# Patient Record
Sex: Female | Born: 1995 | Race: Black or African American | Hispanic: No | Marital: Single | Smoking: Never smoker
Health system: Southern US, Community
[De-identification: ages and names within clinical notes are randomized; demographics above are authoritative.]

## PROBLEM LIST (undated history)

## (undated) DIAGNOSIS — A048 Other specified bacterial intestinal infections: Secondary | ICD-10-CM

## (undated) DIAGNOSIS — G4700 Insomnia, unspecified: Secondary | ICD-10-CM

## (undated) DIAGNOSIS — F419 Anxiety disorder, unspecified: Secondary | ICD-10-CM

## (undated) HISTORY — DX: Anxiety disorder, unspecified: F41.9

## (undated) HISTORY — DX: Other specified bacterial intestinal infections: A04.8

## (undated) HISTORY — DX: Insomnia, unspecified: G47.00

## (undated) HISTORY — PX: NO PAST SURGERIES: SHX2092

---

## 2016-06-25 ENCOUNTER — Emergency Department (HOSPITAL_COMMUNITY)
Admission: EM | Admit: 2016-06-25 | Discharge: 2016-06-25 | Disposition: A | Discharge status: Home / Self Care | Payer: BLUE CROSS/BLUE SHIELD | Attending: Emergency Medicine | Admitting: Emergency Medicine

## 2016-06-25 ENCOUNTER — Emergency Department (HOSPITAL_COMMUNITY): Payer: BLUE CROSS/BLUE SHIELD

## 2016-06-25 ENCOUNTER — Encounter (HOSPITAL_COMMUNITY): Payer: Self-pay | Admitting: Emergency Medicine

## 2016-06-25 DIAGNOSIS — R55 Syncope and collapse: Secondary | ICD-10-CM | POA: Insufficient documentation

## 2016-06-25 DIAGNOSIS — E860 Dehydration: Secondary | ICD-10-CM | POA: Insufficient documentation

## 2016-06-25 LAB — BASIC METABOLIC PANEL
Anion gap: 7 (ref 5–15)
BUN: 11 mg/dL (ref 6–20)
CHLORIDE: 107 mmol/L (ref 101–111)
CO2: 26 mmol/L (ref 22–32)
CREATININE: 0.74 mg/dL (ref 0.44–1.00)
Calcium: 9.2 mg/dL (ref 8.9–10.3)
GFR calc non Af Amer: >60 mL/min (ref >60)
Glucose, Bld: 94 mg/dL (ref 65–99)
POTASSIUM: 4.2 mmol/L (ref 3.5–5.1)
SODIUM: 140 mmol/L (ref 135–145)

## 2016-06-25 LAB — URINALYSIS, ROUTINE W REFLEX MICROSCOPIC
Bilirubin Urine: NEGATIVE
GLUCOSE, UA: NEGATIVE mg/dL
Hgb urine dipstick: NEGATIVE
KETONES UR: NEGATIVE mg/dL
LEUKOCYTES UA: NEGATIVE
NITRITE: NEGATIVE
PROTEIN: NEGATIVE mg/dL
Specific Gravity, Urine: 1.009 (ref 1.005–1.030)
pH: 7.5 (ref 5.0–8.0)

## 2016-06-25 LAB — I-STAT BETA HCG BLOOD, ED (MC, WL, AP ONLY)

## 2016-06-25 LAB — CBC
HCT: 38.8 % (ref 36.0–46.0)
Hemoglobin: 11.8 g/dL — ABNORMAL LOW (ref 12.0–15.0)
MCH: 25.8 pg — ABNORMAL LOW (ref 26.0–34.0)
MCHC: 30.4 g/dL (ref 30.0–36.0)
MCV: 84.7 fL (ref 78.0–100.0)
PLATELETS: 264 10*3/uL (ref 150–400)
RBC: 4.58 MIL/uL (ref 3.87–5.11)
RDW: 14.3 % (ref 11.5–15.5)
WBC: 6.4 10*3/uL (ref 4.0–10.5)

## 2016-06-25 LAB — D-DIMER, QUANTITATIVE (NOT AT ARMC): D-Dimer, Quant: 0.56 ug/mL-FEU — ABNORMAL HIGH (ref 0.00–0.50)

## 2016-06-25 LAB — CBG MONITORING, ED: Glucose-Capillary: 94 mg/dL (ref 65–99)

## 2016-06-25 MED ORDER — IOPAMIDOL (ISOVUE-370) INJECTION 76%
100.0000 mL | Freq: Once | INTRAVENOUS | Status: AC | PRN
  Administered 2016-06-25: 100 mL via INTRAVENOUS

## 2016-06-25 MED ORDER — SODIUM CHLORIDE 0.9 % IV BOLUS (SEPSIS)
1000.0000 mL | Freq: Once | INTRAVENOUS | Status: AC
  Administered 2016-06-25: 1000 mL via INTRAVENOUS

## 2016-06-25 NOTE — Discharge Instructions (Addendum)
Make sure to drink plenty of fluids, at least 8, 8oz glasses of water daily. Patient to have 3 hearty meals daily. Please follow-up and establish care with a primary care provider for further workup and evaluation of your symptoms. Please return to emergency department if you develop any new or worsening symptoms.

## 2016-06-25 NOTE — ED Notes (Signed)
Pt ambulated for over 50 feet.  Pt maintained a steady gait and showed no signs of distress.  Heart rate remained at 125 and RR stayed at 33.

## 2016-06-25 NOTE — ED Notes (Signed)
Bed: [id number] Expected date:  Expected time:  Means of arrival:  Comments: EMS- 19yo F, syncope x 2

## 2016-06-25 NOTE — ED Provider Notes (Signed)
[facility] Provider Note   CSN: [id number] Arrival date & time: 06/25/16  1635     History   Chief Complaint Chief Complaint  Patient presents with  . Loss of Consciousness    HPI Marissa Dickson is a 20 y.o. female who presents following 2 episodes of syncope that occurred this morning. Patient reports she was about to get shower this morning and she began feeling dizzy, nauseated and then woke up on the floor of the bathroom. Patient does not think she hit her head and denies headache, dizziness, lightheadedness at this time. Patient later had another episode of syncope outside on the grass during a fire drill in her apartment complex. She was surrounded by people when she woke up. Patient denies any pain. She states she only feels drowsy now. Patient has not eaten today. Patient denies any chest pain, shortness of breath, abdominal pain, current nausea, vomiting, urinary symptoms.  HPI  History reviewed. No pertinent past medical history.  There are no active problems to display for this patient.   History reviewed. No pertinent surgical history.  OB History    No data available       Home Medications    Prior to Admission medications   Not on File    Family History History reviewed. No pertinent family history.  Social History Social History  Substance Use Topics  . Smoking status: Never Smoker  . Smokeless tobacco: Never Used  . Alcohol use Yes     Comment: occasionally     Allergies   Review of patient's allergies indicates no known allergies.   Review of Systems Review of Systems  Constitutional: Negative for chills and fever.  HENT: Negative for facial swelling and sore throat.   Respiratory: Negative for shortness of breath.   Cardiovascular: Negative for chest pain.  Gastrointestinal: Negative for abdominal pain, nausea and vomiting.  Genitourinary: Negative for dysuria.  Musculoskeletal: Negative for back pain.  Skin: Negative for  rash and wound.  Neurological: Negative for dizziness, light-headedness and headaches.  Psychiatric/Behavioral: The patient is not nervous/anxious.      Physical Exam Updated Vital Signs BP 116/74   Pulse 77   Temp 99.7 F (37.6 C) (Oral)   Resp 17   SpO2 100%   Physical Exam  Constitutional: She appears well-developed and well-nourished. No distress.  HENT:  Head: Normocephalic and atraumatic.  Mouth/Throat: Oropharynx is clear and moist. No oropharyngeal exudate.  Eyes: Conjunctivae and EOM are normal. Pupils are equal, round, and reactive to light. Right eye exhibits no discharge. Left eye exhibits no discharge. No scleral icterus.  Neck: Normal range of motion. Neck supple. No thyromegaly present.  Cardiovascular: Normal rate, regular rhythm, normal heart sounds and intact distal pulses.  Exam reveals no gallop and no friction rub.   No murmur heard. Pulmonary/Chest: Effort normal and breath sounds normal. No stridor. No respiratory distress. She has no wheezes. She has no rales.  Abdominal: Soft. Bowel sounds are normal. She exhibits no distension. There is no tenderness. There is no rebound and no guarding.  Musculoskeletal: She exhibits no edema.  Lymphadenopathy:    She has no cervical adenopathy.  Neurological: She is alert. Coordination normal.  CN 3-12 intact; normal sensation throughout; 5/5 strength in all 4 extremities; equal bilateral grip strength; no ataxia on finger to nose   Skin: Skin is warm and dry. No rash noted. She is not diaphoretic. No pallor.  Psychiatric: She has a normal mood and affect.  Nursing  note and vitals reviewed.    ED Treatments / Results  Labs (all labs ordered are listed, but only abnormal results are displayed) Labs Reviewed  CBC - Abnormal; Notable for the following:       Result Value   Hemoglobin 11.8 (*)    MCH 25.8 (*)    All other components within normal limits  D-DIMER, QUANTITATIVE (NOT AT [facility]) - Abnormal; Notable for  the following:    D-Dimer, Quant 0.56 (*)    All other components within normal limits  BASIC METABOLIC PANEL  URINALYSIS, ROUTINE W REFLEX MICROSCOPIC (NOT AT [facility])  CBG MONITORING, ED  I-STAT BETA HCG BLOOD, ED ([facility], [facility], [facility] ONLY)    EKG  EKG Interpretation  Date/Time:  Friday June 25 2016 16:51:55 EDT Ventricular Rate:  90 PR Interval:    QRS Duration: 96 QT Interval:  344 QTC Calculation: 421 R Axis:   79 Text Interpretation:  Sinus rhythm Borderline short PR interval No old tracing to compare Confirmed by BELFI  MD, MELANIE ([id number]) on 06/25/2016 5:52:29 PM       Radiology Ct Angio Chest Pe W And/or Wo Contrast  Result Date: 06/25/2016 CLINICAL DATA:  Syncopal episode today at 9:15 this morning with second syncopal episode this afternoon. Elevated D-dimer. EXAM: CT ANGIOGRAPHY CHEST WITH CONTRAST TECHNIQUE: Multidetector CT imaging of the chest was performed using the standard protocol during bolus administration of intravenous contrast. Multiplanar CT image reconstructions and MIPs were obtained to evaluate the vascular anatomy. CONTRAST:  100 mL Isovue 370 IV. COMPARISON:  None. FINDINGS: Cardiovascular: Heart is normal in size. Thoracic aorta is within normal. Pulmonary arterial system is normal without emboli. Mediastinum/Nodes: No evidence of mediastinal or hilar adenopathy. Subtle air-fluid level over the distal esophagus likely due to dysmotility or reflux. Lungs/Pleura: Lungs are well inflated without consolidation or effusion. No pneumothorax. Airways are normal. Upper Abdomen: Within normal. Musculoskeletal: Within normal. Review of the MIP images confirms the above findings. IMPRESSION: No acute cardiopulmonary disease. No evidence of pulmonary embolism. Electronically Signed   By: Elberta Fortisaniel  Boyle M.D.   On: 06/25/2016 21:43    Procedures Procedures (including critical care time)  Medications Ordered in ED Medications  sodium chloride 0.9 % bolus 1,000 mL (0 mLs  Intravenous Stopped 06/25/16 2000)  sodium chloride 0.9 % bolus 1,000 mL (0 mLs Intravenous Stopped 06/25/16 2307)  iopamidol (ISOVUE-370) 76 % injection 100 mL (100 mLs Intravenous Contrast Given 06/25/16 2051)     Initial Impression / Assessment and Plan / ED Course  I have reviewed the triage vital signs and the nursing notes.  Pertinent labs & imaging results that were available during my care of the patient were reviewed by me and considered in my medical decision making (see chart for details).  Clinical Course    CBC, BMP, UA WNL. EKG shows Sinus rhythm Borderline short PR interval. Normal neuro exam, no focal deficits. Patient tachycardic throughout ED course, concern for PE prompted D-dimer. D-dimer 0.56. CT angio shows no PE or active cardiopulmonary disease. Patient asymptomatic in ED. Given 2L NS. Suspect dehydration as cause of symptoms. Patient admits to not drinking enough water. Also had not eaten today. Discharge home with follow up to PCP for further evaluation and treatment of symptoms. Potential further workup including Holter monitor. I discussed patient case with Dr. Fredderick PhenixBelfi who guided the patient's management and agrees with plan. Patient discharged in satisfactory condition.  Final Clinical Impressions(s) / ED Diagnoses   Final diagnoses:  Syncope,  unspecified syncope type  Dehydration    New Prescriptions There are no discharge medications for this patient.    Emi Holes, PA-C 06/26/16 2134    Rolan Bucco, MD 06/27/16 [phone]

## 2016-06-25 NOTE — ED Provider Notes (Signed)
Chest CT angios was reviewed.  There is no evidence of pulmonary embolus.  Patient will be discharged with instructions for hydration and follow-up   Earley FavorGail [place] Ice, NP 06/25/16 2300    Rolan BuccoMelanie Belfi, MD 06/25/16 2352

## 2016-06-25 NOTE — ED Triage Notes (Signed)
Pt c/o sycopal episode today about 0915, went outside at 1545 and had second syncopal episode. Length of unconsciousness unknown. No hx syncopal episodes, no medical hx. Reports she has not eaten today. Shallow abrasion to chin and left knee without signs of fracture.

## 2016-06-25 NOTE — Progress Notes (Signed)
Patient listed a snot having insurance [place].  [facility].  Patient reports she has insurance through her school [employer].  She [facility]  [facility] encouraged patient to call the phone number on the bac of her insurance card or go to insurance website to help her find a doctor who is close to her school for primary care needs.  Patient verbalized understanding.  No further EDCM needs at this time.

## 2016-06-25 NOTE — ED Provider Notes (Deleted)
CT angiogram was reviewed.  There is no evidence of pulmonary embolus.  Patient is discharged home with dehydration instructions and follow-up   Earley FavorGail Parul Porcelli, NP 06/25/16 2303

## 2017-11-26 IMAGING — CT CT ANGIO CHEST
2 of 6 series · 19 of 36 positions shown · IV contrast (ISOVUE 370)
Comparison: None.

CLINICAL DATA: Syncopal episode today at [DATE] this morning with
second syncopal episode this afternoon. Elevated D-dimer.

EXAM:
CT ANGIOGRAPHY CHEST WITH CONTRAST
TECHNIQUE: Multidetector CT imaging of the chest was performed using the
standard protocol during bolus administration of intravenous
contrast. Multiplanar CT image reconstructions and MIPs were
obtained to evaluate the vascular anatomy.
CONTRAST:  100 mL Isovue 370 IV.

[Series 8: thins for pacs · axial · 0.57mm/px · z∈[-298,-94]mm · 18 of 229 slices shown]
[im 12/229  lung]
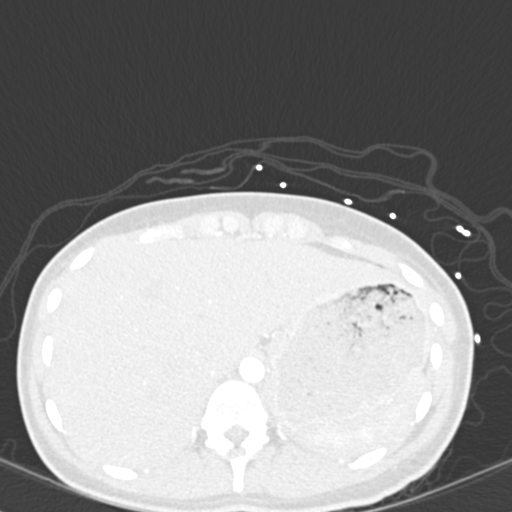
[im 23/229  mediastinal]
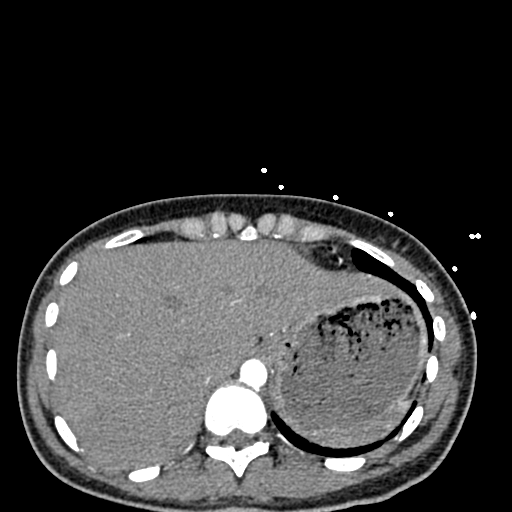
[im 35/229  lung]
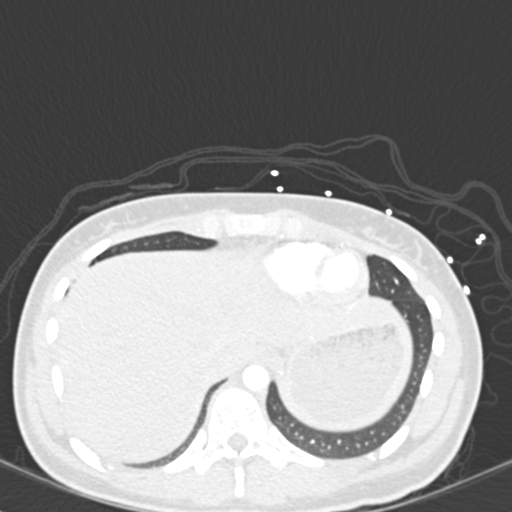
[im 46/229  mediastinal]
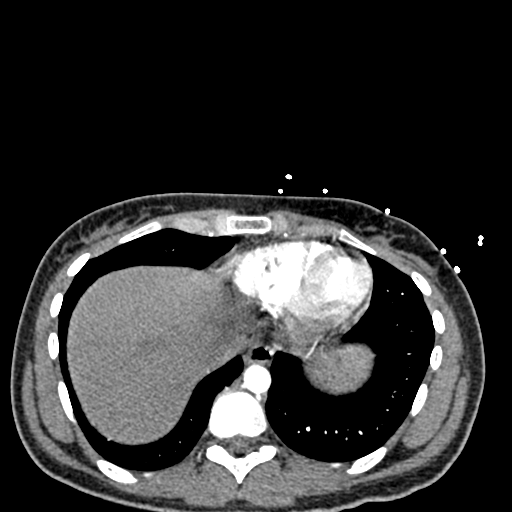
[im 58/229  lung]
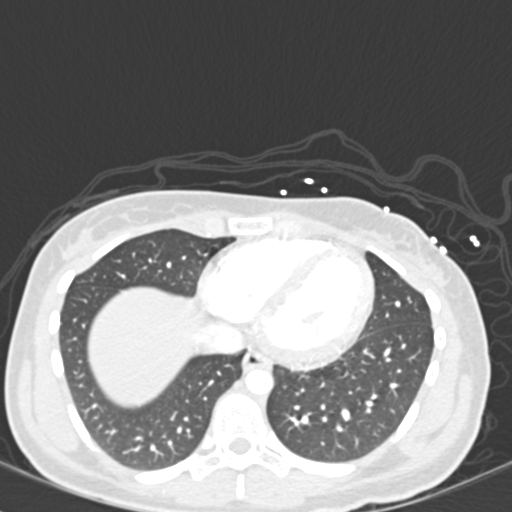
[im 69/229  mediastinal]
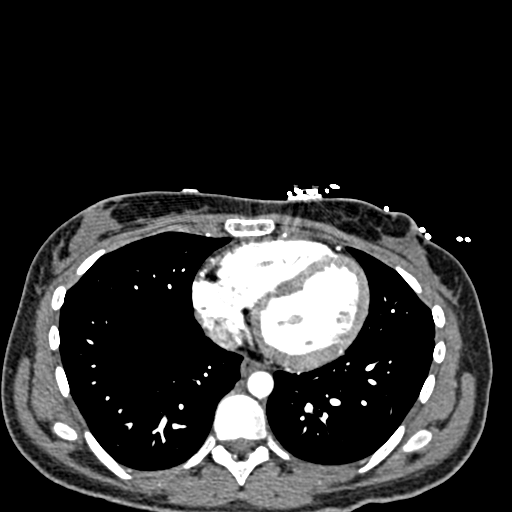
[im 80/229  lung]
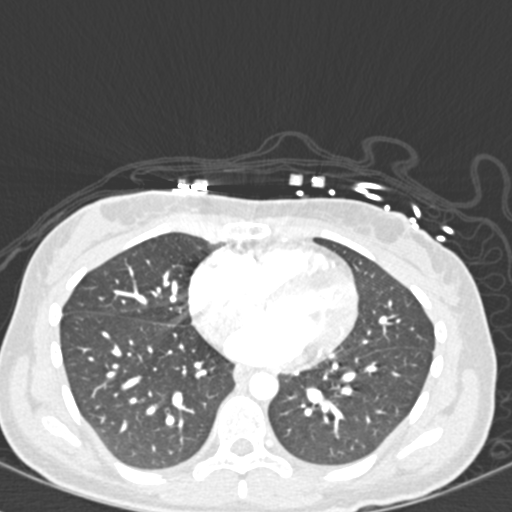
[im 92/229  mediastinal]
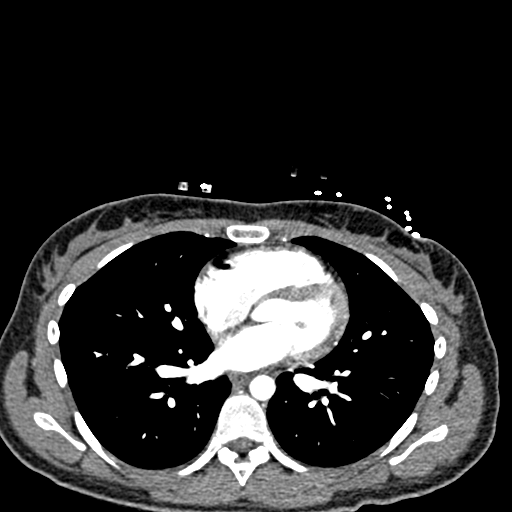
[im 103/229  lung]
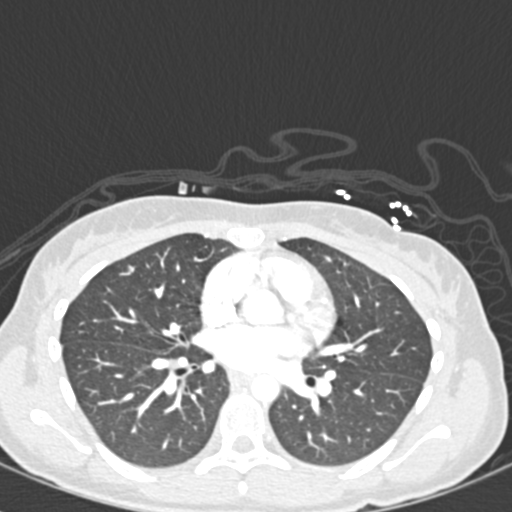
[im 126/229  mediastinal]
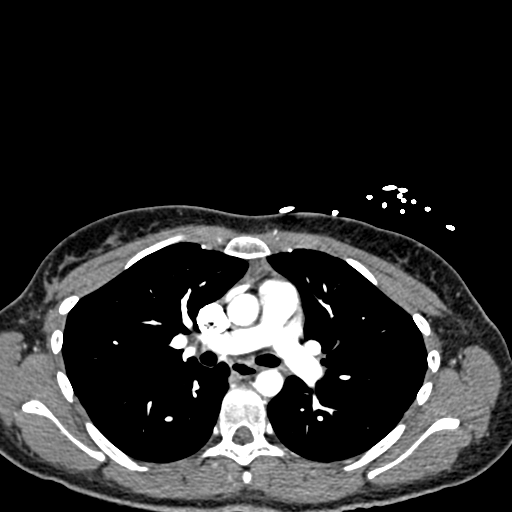
[im 137/229  lung]
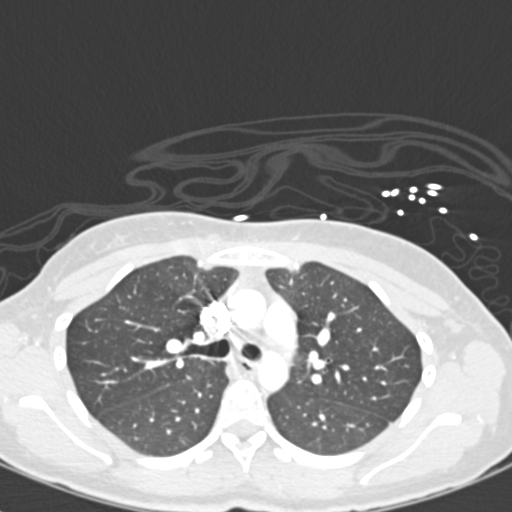
[im 149/229  mediastinal]
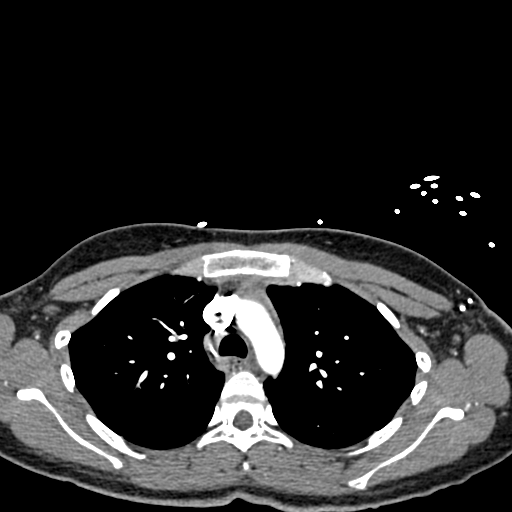
[im 160/229  lung]
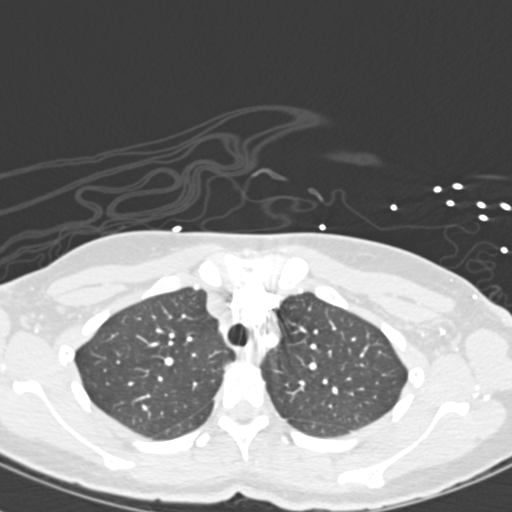
[im 172/229  mediastinal]
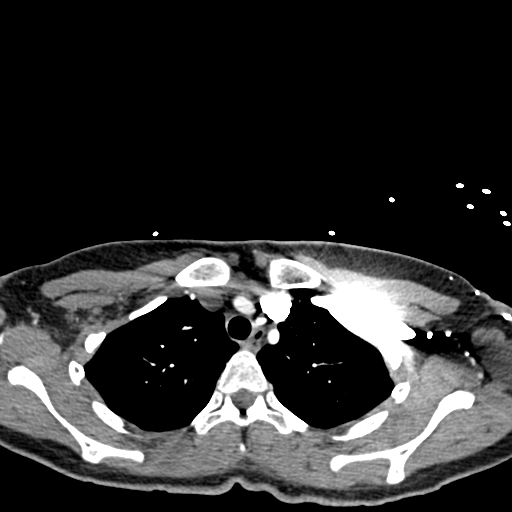
[im 183/229  lung]
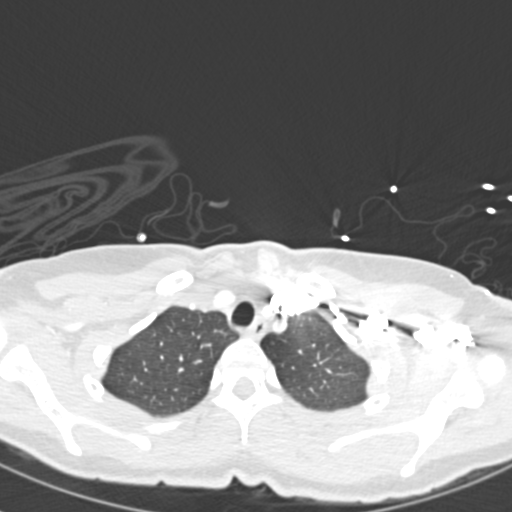
[im 194/229  mediastinal]
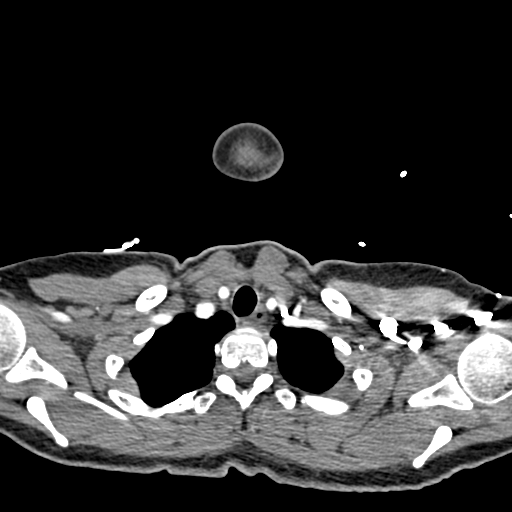
[im 206/229  lung]
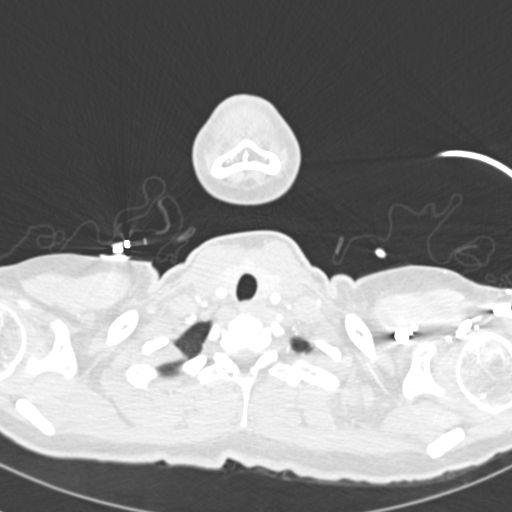
[im 217/229  mediastinal]
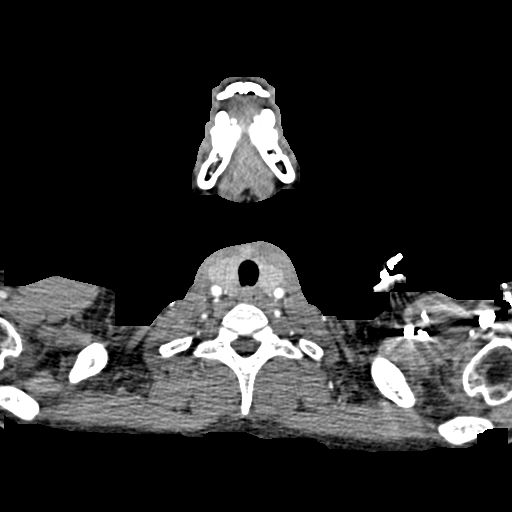

[Series 10: coronal mpr · coronal · 0.47mm/px · 1 of 91 slices shown]
[im 46/91  mediastinal]
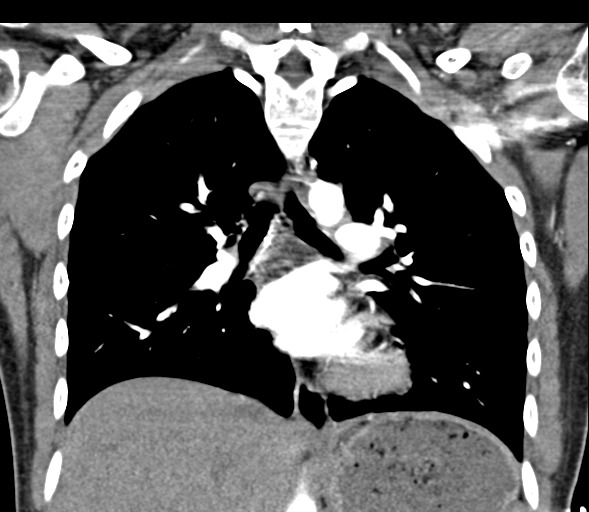

[19 of 36 positions shown; findings below may reference images not displayed]

FINDINGS: Cardiovascular: Heart is normal in size. Thoracic aorta is within
normal. Pulmonary arterial system is normal without emboli.

Mediastinum/Nodes: No evidence of mediastinal or hilar adenopathy.
Subtle air-fluid level over the distal esophagus likely due to
dysmotility or reflux.

Lungs/Pleura: Lungs are well inflated without consolidation or
effusion. No pneumothorax. Airways are normal.

Upper Abdomen: Within normal.

Musculoskeletal: Within normal.

Review of the MIP images confirms the above findings.
IMPRESSION: No acute cardiopulmonary disease. No evidence of pulmonary embolism.

## 2020-12-23 ENCOUNTER — Other Ambulatory Visit: Payer: Self-pay

## 2020-12-23 ENCOUNTER — Encounter: Payer: Self-pay | Admitting: Nurse Practitioner

## 2020-12-23 ENCOUNTER — Ambulatory Visit (HOSPITAL_COMMUNITY)
Admission: EM | Admit: 2020-12-23 | Discharge: 2020-12-23 | Disposition: A | Discharge status: Home / Self Care | Payer: BLUE CROSS/BLUE SHIELD | Attending: Physician Assistant | Admitting: Physician Assistant

## 2020-12-23 ENCOUNTER — Encounter (HOSPITAL_COMMUNITY): Payer: Self-pay

## 2020-12-23 DIAGNOSIS — R1013 Epigastric pain: Secondary | ICD-10-CM | POA: Insufficient documentation

## 2020-12-23 DIAGNOSIS — R112 Nausea with vomiting, unspecified: Secondary | ICD-10-CM | POA: Insufficient documentation

## 2020-12-23 LAB — COMPREHENSIVE METABOLIC PANEL
ALT: 29 U/L (ref 0–44)
AST: 21 U/L (ref 15–41)
Albumin: 4.3 g/dL (ref 3.5–5.0)
Alkaline Phosphatase: 49 U/L (ref 38–126)
Anion gap: 14 (ref 5–15)
BUN: 6 mg/dL (ref 6–20)
CO2: 24 mmol/L (ref 22–32)
Calcium: 9.4 mg/dL (ref 8.9–10.3)
Chloride: 101 mmol/L (ref 98–111)
Creatinine, Ser: 0.95 mg/dL (ref 0.44–1.00)
GFR, Estimated: >60 mL/min (ref >60)
Glucose, Bld: 73 mg/dL (ref 70–99)
Potassium: 3.6 mmol/L (ref 3.5–5.1)
Sodium: 139 mmol/L (ref 135–145)
Total Bilirubin: 0.7 mg/dL (ref 0.3–1.2)
Total Protein: 7.4 g/dL (ref 6.5–8.1)

## 2020-12-23 LAB — POCT URINALYSIS DIPSTICK, ED / UC
Glucose, UA: NEGATIVE mg/dL
Ketones, ur: >=160 mg/dL — AB
Leukocytes,Ua: NEGATIVE
Nitrite: NEGATIVE
Protein, ur: NEGATIVE mg/dL
Specific Gravity, Urine: >=1.03 (ref 1.005–1.030)
Urobilinogen, UA: 0.2 mg/dL (ref 0.0–1.0)
pH: 5.5 (ref 5.0–8.0)

## 2020-12-23 LAB — CBC WITH DIFFERENTIAL/PLATELET
Abs Immature Granulocytes: 0.01 10*3/uL (ref 0.00–0.07)
Basophils Absolute: 0.1 10*3/uL (ref 0.0–0.1)
Basophils Relative: 2 %
Eosinophils Absolute: 0 10*3/uL (ref 0.0–0.5)
Eosinophils Relative: 1 %
HCT: 41.9 % (ref 36.0–46.0)
Hemoglobin: 12.9 g/dL (ref 12.0–15.0)
Immature Granulocytes: 0 %
Lymphocytes Relative: 33 %
Lymphs Abs: 1.7 10*3/uL (ref 0.7–4.0)
MCH: 25.3 pg — ABNORMAL LOW (ref 26.0–34.0)
MCHC: 30.8 g/dL (ref 30.0–36.0)
MCV: 82.3 fL (ref 80.0–100.0)
Monocytes Absolute: 0.3 10*3/uL (ref 0.1–1.0)
Monocytes Relative: 6 %
Neutro Abs: 3 10*3/uL (ref 1.7–7.7)
Neutrophils Relative %: 58 %
Platelets: 264 10*3/uL (ref 150–400)
RBC: 5.09 MIL/uL (ref 3.87–5.11)
RDW: 16.2 % — ABNORMAL HIGH (ref 11.5–15.5)
WBC: 5.1 10*3/uL (ref 4.0–10.5)
nRBC: 0 % (ref 0.0–0.2)

## 2020-12-23 LAB — LIPASE, BLOOD: Lipase: 27 U/L (ref 11–51)

## 2020-12-23 LAB — POC URINE PREG, ED: Preg Test, Ur: NEGATIVE

## 2020-12-23 NOTE — ED Triage Notes (Signed)
Pt in with c/o epigastric pain and back pain that has been going on for 2 weeks now  States she was having urinary frequency that has resolved

## 2020-12-23 NOTE — ED Provider Notes (Signed)
[facility]    CSN: [id number] Arrival date & time: 12/23/20  1134      History   Chief Complaint Chief Complaint  Patient presents with  . Abdominal Pain  . Back Pain    HPI Marissa Dickson is a 25 y.o. female.   Patient presents today with a 3 week history of persistent upper abdominal pain.  She denies any change in diet prior to symptom onset.  Reports pain is rated 4 on a 0-10 pain scale, localized to epigastrium without radiation, described as aching, worse following oral intake, no alleviating factors identified.  She has not identified any specific trigger foods.  She has been evaluated by [facility] and prescribed Zofran and Prilosec which she has been taking as needed.  She has not seen a gastroenterologist in the past and denies history of GI condition.  She denies previous abdominal surgeries still has gallbladder and appendix.  She denies any medication changes.  Denies any bowel changes; initially had some constipation but this resolved with MiraLAX and she is now having normal bowel movements without blood or mucus.     History reviewed. No pertinent past medical history.  There are no problems to display for this patient.   History reviewed. No pertinent surgical history.  OB History   No obstetric history on file.      Home Medications    Prior to Admission medications   Not on File    Family History Family History  Problem Relation Age of Onset  . Cancer Mother     Social History Social History   Tobacco Use  . Smoking status: Never Smoker  . Smokeless tobacco: Never Used  Substance Use Topics  . Alcohol use: Yes    Comment: occasionally  . Drug use: No     Allergies   Patient has no known allergies.   Review of Systems Review of Systems  Constitutional: Positive for appetite change. Negative for activity change, fatigue and fever.  Respiratory: Negative for cough and shortness of breath.   Cardiovascular:  Negative for chest pain.  Gastrointestinal: Positive for abdominal pain and nausea. Negative for blood in stool, constipation, diarrhea and vomiting.  Neurological: Negative for dizziness, light-headedness and headaches.     Physical Exam Triage Vital Signs ED Triage Vitals  Enc Vitals Group     BP 12/23/20 1257 138/89     Pulse Rate 12/23/20 1257 77     Resp 12/23/20 1257 18     Temp 12/23/20 1257 97.8 F (36.6 C)     Temp src --      SpO2 12/23/20 1257 100 %     Weight --      Height --      Head Circumference --      Peak Flow --      Pain Score 12/23/20 1255 2     Pain Loc --      Pain Edu? --      Excl. in GC? --    No data found.  Updated Vital Signs BP 138/89   Pulse 77   Temp 97.8 F (36.6 C)   Resp 18   LMP 12/18/2020 (Exact Date)   SpO2 100%   Visual Acuity Right Eye Distance:   Left Eye Distance:   Bilateral Distance:    Right Eye Near:   Left Eye Near:    Bilateral Near:     Physical Exam Vitals reviewed.  Constitutional:  General: She is awake. She is not in acute distress.    Appearance: Normal appearance. She is not ill-appearing.     Comments: Very pleasant female appears stated age in no acute distress  HENT:     Head: Normocephalic and atraumatic.  Cardiovascular:     Rate and Rhythm: Normal rate and regular rhythm.     Heart sounds: No murmur heard.   Pulmonary:     Effort: Pulmonary effort is normal.     Breath sounds: Normal breath sounds. No wheezing, rhonchi or rales.     Comments: Clear to auscultation bilaterally Abdominal:     General: Bowel sounds are normal.     Palpations: Abdomen is soft.     Tenderness: There is abdominal tenderness in the epigastric area. There is no right CVA tenderness, left CVA tenderness, guarding or rebound. Negative signs include Murphy's sign.     Comments: Mild tenderness to palpation in epigastrium.  No evidence of acute abdomen on physical exam.  Negative Murphy sign.  Psychiatric:         Behavior: Behavior is cooperative.      UC Treatments / Results  Labs (all labs ordered are listed, but only abnormal results are displayed) Labs Reviewed  POCT URINALYSIS DIPSTICK, ED / UC - Abnormal; Notable for the following components:      Result Value   Bilirubin Urine MODERATE (*)    Ketones, ur >=160 (*)    Hgb urine dipstick MODERATE (*)    All other components within normal limits  URINE CULTURE  CBC WITH DIFFERENTIAL/PLATELET  COMPREHENSIVE METABOLIC PANEL  LIPASE, BLOOD  POC URINE PREG, ED    EKG   Radiology No results found.  Procedures Procedures (including critical care time)  Medications Ordered in UC Medications - No data to display  Initial Impression / Assessment and Plan / UC Course  I have reviewed the triage vital signs and the nursing notes.  Pertinent labs & imaging results that were available during my care of the patient were reviewed by me and considered in my medical decision making (see chart for details).     Vital signs and physical exam reassuring; no indication for emergent evaluation or imaging.  UA showed blood likely related to menstrual cycle but will obtain culture to rule out infection.  Urine pregnancy test was negative.  CBC, CMP, lipase obtained-results pending.  Encouraged patient to take Prilosec consecutively to see if this helps manage symptoms.  Discussed that we are unable to obtain imaging to investigate etiology and if she has persistent/worsening symptoms she is to go to the emergency room for further evaluation.  Recommended she eat a bland diet and drink plenty of fluids.  She was provided school excuse note.  Strict return precautions given to which patient expressed understanding.  She is to follow-up with gastroenterology outpatient assuming no worsening of symptoms.  Final Clinical Impressions(s) / UC Diagnoses   Final diagnoses:  Abdominal pain, epigastric  Nausea and vomiting, intractability of vomiting not  specified, unspecified vomiting type     Discharge Instructions     I would continue the medication prescribed by your student health center as we discussed.  Please make sure that you are drinking plenty of fluid and eating a bland diet (avoid fatty, spicy, acidic foods).  Please follow-up with the gastroenterologist as we discussed to ensure we investigate your symptoms.  If you have any worsening symptoms you need to go to the emergency room for imaging as  we discussed.    ED Prescriptions    None     PDMP not reviewed this encounter.   Jeani Hawking, PA-C 12/23/20 1338

## 2020-12-23 NOTE — Discharge Instructions (Addendum)
I would continue the medication prescribed by your student health center as we discussed.  Please make sure that you are drinking plenty of fluid and eating a bland diet (avoid fatty, spicy, acidic foods).  Please follow-up with the gastroenterologist as we discussed to ensure we investigate your symptoms.  If you have any worsening symptoms you need to go to the emergency room for imaging as we discussed.

## 2020-12-24 LAB — URINE CULTURE: Culture: NO GROWTH

## 2021-01-07 ENCOUNTER — Ambulatory Visit: Payer: Self-pay | Admitting: Nurse Practitioner

## 2021-04-06 DIAGNOSIS — R768 Other specified abnormal immunological findings in serum: Secondary | ICD-10-CM

## 2021-04-06 HISTORY — DX: Other specified abnormal immunological findings in serum: R76.8

## 2021-10-21 ENCOUNTER — Emergency Department (HOSPITAL_COMMUNITY)
Admission: EM | Admit: 2021-10-21 | Discharge: 2021-10-21 | Disposition: A | Discharge status: Home / Self Care | Payer: 59 | Attending: Emergency Medicine | Admitting: Emergency Medicine

## 2021-10-21 ENCOUNTER — Encounter (HOSPITAL_COMMUNITY): Payer: Self-pay

## 2021-10-21 DIAGNOSIS — R109 Unspecified abdominal pain: Secondary | ICD-10-CM | POA: Diagnosis not present

## 2021-10-21 DIAGNOSIS — Z5321 Procedure and treatment not carried out due to patient leaving prior to being seen by health care provider: Secondary | ICD-10-CM | POA: Diagnosis not present

## 2021-10-21 NOTE — ED Provider Triage Note (Signed)
Emergency Medicine Provider Triage Evaluation Note  Khyleigh Eriksson , a 26 y.o. female  was evaluated in triage.  Pt complains of abdominal pain.  Reports history of H. Pylori.  States that she was treated, but symptoms didn't improve.  Onset was about 1 year ago.  Still has persistent daily pain.  No acute changes, just seems frustrated that PCP hasn't been able to fix the problem.  Review of Systems  Positive: Abdominal pain Negative: Fever, chills  Physical Exam  BP 132/74 (BP Location: Left Arm)    Pulse 84    Temp 98.2 F (36.8 C) (Oral)    Resp 20    SpO2 100%  Gen:   Awake, no distress   Resp:  Normal effort  MSK:   Moves extremities without difficulty  Other:    Medical Decision Making  Medically screening exam initiated at 12:39 PM.  Appropriate orders placed.  Sokha Niwa was informed that the remainder of the evaluation will be completed by another provider, this initial triage assessment does not replace that evaluation, and the importance of remaining in the ED until their evaluation is complete.  Chronic abdominal pain   Montine Circle, PA-C 10/21/21 1241

## 2021-10-21 NOTE — ED Triage Notes (Signed)
Pt arrived via POV, c/o diffuse abd pain on and off for a year. Was seen by PCP and told she had h pylori, now requesting U/S

## 2021-10-22 ENCOUNTER — Other Ambulatory Visit: Payer: Self-pay

## 2021-10-26 ENCOUNTER — Other Ambulatory Visit (INDEPENDENT_AMBULATORY_CARE_PROVIDER_SITE_OTHER): Payer: Self-pay

## 2021-10-26 ENCOUNTER — Encounter: Payer: Self-pay | Admitting: Internal Medicine

## 2021-10-26 ENCOUNTER — Ambulatory Visit (INDEPENDENT_AMBULATORY_CARE_PROVIDER_SITE_OTHER): Payer: Self-pay | Admitting: Internal Medicine

## 2021-10-26 VITALS — BP 100/60 | HR 76 | Ht 65.0 in | Wt 109.2 lb

## 2021-10-26 DIAGNOSIS — G4700 Insomnia, unspecified: Secondary | ICD-10-CM

## 2021-10-26 DIAGNOSIS — G8929 Other chronic pain: Secondary | ICD-10-CM | POA: Insufficient documentation

## 2021-10-26 DIAGNOSIS — R1013 Epigastric pain: Secondary | ICD-10-CM

## 2021-10-26 DIAGNOSIS — F419 Anxiety disorder, unspecified: Secondary | ICD-10-CM

## 2021-10-26 DIAGNOSIS — R634 Abnormal weight loss: Secondary | ICD-10-CM

## 2021-10-26 DIAGNOSIS — F32A Depression, unspecified: Secondary | ICD-10-CM

## 2021-10-26 LAB — COMPREHENSIVE METABOLIC PANEL
ALT: 17 U/L (ref 0–35)
AST: 16 U/L (ref 0–37)
Albumin: 4.4 g/dL (ref 3.5–5.2)
Alkaline Phosphatase: 51 U/L (ref 39–117)
BUN: 10 mg/dL (ref 6–23)
CO2: 33 mEq/L — ABNORMAL HIGH (ref 19–32)
Calcium: 9.2 mg/dL (ref 8.4–10.5)
Chloride: 103 mEq/L (ref 96–112)
Creatinine, Ser: 0.84 mg/dL (ref 0.40–1.20)
GFR: 96.65 mL/min (ref >60.00)
Glucose, Bld: 103 mg/dL — ABNORMAL HIGH (ref 70–99)
Potassium: 3.9 mEq/L (ref 3.5–5.1)
Sodium: 140 mEq/L (ref 135–145)
Total Bilirubin: 0.2 mg/dL (ref 0.2–1.2)
Total Protein: 7.3 g/dL (ref 6.0–8.3)

## 2021-10-26 LAB — CBC WITH DIFFERENTIAL/PLATELET
Basophils Absolute: 0 10*3/uL (ref 0.0–0.1)
Basophils Relative: 1 % (ref 0.0–3.0)
Eosinophils Absolute: 0.1 10*3/uL (ref 0.0–0.7)
Eosinophils Relative: 2.1 % (ref 0.0–5.0)
HCT: 38.7 % (ref 36.0–46.0)
Hemoglobin: 12.3 g/dL (ref 12.0–15.0)
Lymphocytes Relative: 52.4 % — ABNORMAL HIGH (ref 12.0–46.0)
Lymphs Abs: 2.3 10*3/uL (ref 0.7–4.0)
MCHC: 31.7 g/dL (ref 30.0–36.0)
MCV: 79.6 fl (ref 78.0–100.0)
Monocytes Absolute: 0.3 10*3/uL (ref 0.1–1.0)
Monocytes Relative: 6.7 % (ref 3.0–12.0)
Neutro Abs: 1.6 10*3/uL (ref 1.4–7.7)
Neutrophils Relative %: 37.8 % — ABNORMAL LOW (ref 43.0–77.0)
Platelets: 211 10*3/uL (ref 150.0–400.0)
RBC: 4.86 Mil/uL (ref 3.87–5.11)
RDW: 16.5 % — ABNORMAL HIGH (ref 11.5–15.5)
WBC: 4.4 10*3/uL (ref 4.0–10.5)

## 2021-10-26 MED ORDER — DICYCLOMINE HCL 20 MG PO TABS: 20.0000 mg | ORAL_TABLET | Freq: Four times a day (QID) | ORAL | 0 refills | Status: AC | PRN

## 2021-10-26 NOTE — Patient Instructions (Addendum)
Stop Pantoprazole for 2 weeks, then submit stool study for H. Pylori. You may resume Pantoprazole after stool study.   Your provider has requested that you go to the basement level for lab work before leaving today. Press "B" on the elevator. The lab is located at the first door on the left as you exit the elevator.  We have sent the following medications to your pharmacy for you to pick up at your convenience: Dicyclomine    If you are age 26 or younger, your body mass index should be between 19-25. Your Body mass index is 18.17 kg/m. If this is out of the aformentioned range listed, please consider follow up with your Primary Care Provider.   ________________________________________________________  The [facility] providers would like to encourage you to use [facility] to communicate with providers for non-urgent requests or questions.  Due to long hold times on the telephone, sending your provider a message by [facility] may be a faster and more efficient way to get a response.  Please allow 48 business hours for a response.  Please remember that this is for non-urgent requests.  _______________________________________________________  Thank you for choosing me and [facility].  Dr Leone Payor

## 2021-10-26 NOTE — Progress Notes (Signed)
Marissa Dickson 26 y.o. March 06, 1996 [id number]  Assessment & Plan:   Encounter Diagnoses  Name Primary?   Abdominal pain, chronic, epigastric Yes   Loss of weight    Insomnia, unspecified type    Anxiety and depression    Cause of problems not clear.  While it is possible it was H. pylori antibody testing is not that specific.  I think upper GI luminal problems i.e. gastritis ulcer disease gastric dysfunction, and biliary issues remain possible.  The background of anxiety and depression increases the likelihood of functional disturbance but we need to appropriately exclude other problems.  The first order of business I think is to exclude persistent H. pylori issue so I will have her do a stool antigen test.  I am also going to have her do a CBC and CMET.  I have prescribed dicyclomine as needed.  She will hold pantoprazole for 2 weeks prior to the stool antigen test for H. pylori.  Once I see those results we will determine the next steps.  Consider screening for celiac, consider EGD consider abdominal ultrasound.  She may need titration of mirtazapine and/or hydroxyzine or change in therapy.    Lab Results  Component Value Date   CREATININE 0.84 10/26/2021   BUN 10 10/26/2021   NA 140 10/26/2021   K 3.9 10/26/2021   CL 103 10/26/2021   CO2 33 (H) 10/26/2021   Lab Results  Component Value Date   ALT 17 10/26/2021   AST 16 10/26/2021   ALKPHOS 51 10/26/2021   BILITOT 0.2 10/26/2021    Lab Results  Component Value Date   WBC 4.4 10/26/2021   HGB 12.3 10/26/2021   HCT 38.7 10/26/2021   MCV 79.6 10/26/2021   PLT 211.0 10/26/2021     Subjective:   Chief Complaint: Abdominal pain  HPI This is a 26 year old African-American woman, a Consulting civil engineer at [employer], who has had about a years worth of upper abdominal pain.  Epigastric area radiating down towards the umbilicus and sometimes some mild scratching like sensations in  her back as far as radiation.  She has primary care in [place] where home is but she goes to school here and is also been to student health.  The pain was more severe last year and ultimately she has ended up on pantoprazole daily and its not as bad but she has episodes 2 or 3 days a week and they last for 24 hours.  She cannot identify dietary or other triggers.  She has been treated for anxiety and depression with mirtazapine and hydroxyzine.  Mirtazapine does help her sleep better.  She can initiate sleep but then awakens.  When she is not feeling well with this pain she does not sleep well.  Hydroxyzine is not helping anxiety much at this time.  Weight had gone down but is coming up some.  She is having some mild constipation sometimes not moving her bowels for a couple of days.  She does not report any rectal bleeding.  There was some anorexia as well that is improved.  Testing to date includes H. pylori antibodies which were positive.  This testing was done in August 2022 by Miles Costain, NP whom she sees in [place].  She was treated with Biaxin metronidazole omeprazole and amoxicillin for 2 weeks.  No eradication testing was done.  CT abdomen pelvis in August 22 revealed a probable functional ovarian cyst on the right but no other abnormalities.  The patient indicates that sometimes she can feel her aorta pulsing in her abdomen.  Aorta is normal.  Amylase, lipase, hepatitis C antibody, TSH standard food panel allergen testing all negative or normal.  So was a urine chlamydia gonorrhea screen.  Gastroenterology evaluation in December 2022 in [place] is reviewed and they had planned an abdominal ultrasound but the patient has not done so.  She had also gone to the emergency department here in [place] in April where she had normal CBC and CMET as well as a negative pregnancy test, she did have ketones in her urine.  There is no significant alcohol use, she may smoke marijuana about once a week.   She reports that she is eating better eating 2 or 3 full meals a day and she is trying to snack.  Her sister cooks. No Known Allergies Current Meds  Medication Sig   dicyclomine (BENTYL) 20 MG tablet Take 1 tablet (20 mg total) by mouth every 6 (six) hours as needed for spasms (abdominal pain).   hydrOXYzine (ATARAX) 25 MG tablet Take 25 mg by mouth 3 (three) times daily.   mirtazapine (REMERON) 7.5 MG tablet Take 7.5 mg by mouth at bedtime.   pantoprazole (PROTONIX) 40 MG tablet Take 40 mg by mouth daily.   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   family history includes Diabetes in her paternal grandmother; Hyperlipidemia in her father, paternal grandfather, and paternal grandmother; Hypertension in her paternal grandfather and paternal grandmother; Ovarian cancer in her mother.   Review of Systems Somewhat diffusely positive with anxiety and depressed mood at times fatigue and some back pain issues palpitations she has painful menses and night sweats.  Has described a past skin rash.  I do not think that is active.  She feels thirsty at times and says she sometimes leaks urine.  All other review of systems negative.  Objective:   Physical Exam @BP  100/60    Pulse 76    Ht 5\' 5"  (1.651 m)    Wt 109 lb 3.2 oz (49.5 kg)    BMI 18.17 kg/m @  General:  Well-developed, well-nourished and in no acute distress Eyes:  anicteric. ENT:   Mouth and posterior pharynx free of lesions.  Neck:   supple w/o thyromegaly  or mass.  Lungs: Clear to auscultation bilaterally. Heart:  S1S2, no rubs, murmurs, gallops. Abdomen:  soft, non-tender, no hepatosplenomegaly, hernia, or mass and BS+.  Aorta is palpable but not abnormal given body habitus.  No bruit. Lymph:  no cervical or supraclavicular adenopathy. Extremities:   no edema, cyanosis or clubbing Skin   no rash. Neuro:  A&O x 3.  Psych:  appropriate mood and  Affect.   Data Reviewed: See HPI

## 2021-10-27 LAB — HELICOBACTER PYLORI  SPECIAL ANTIGEN
MICRO NUMBER:: 13031381
SPECIMEN QUALITY: ADEQUATE

## 2021-10-29 ENCOUNTER — Other Ambulatory Visit: Payer: Self-pay

## 2021-10-29 ENCOUNTER — Telehealth: Payer: Self-pay | Admitting: Internal Medicine

## 2021-10-29 DIAGNOSIS — F419 Anxiety disorder, unspecified: Secondary | ICD-10-CM

## 2021-10-29 DIAGNOSIS — G8929 Other chronic pain: Secondary | ICD-10-CM

## 2021-10-29 DIAGNOSIS — R634 Abnormal weight loss: Secondary | ICD-10-CM

## 2021-10-29 DIAGNOSIS — R1013 Epigastric pain: Secondary | ICD-10-CM

## 2021-10-29 DIAGNOSIS — F32A Depression, unspecified: Secondary | ICD-10-CM

## 2021-10-29 DIAGNOSIS — G4700 Insomnia, unspecified: Secondary | ICD-10-CM

## 2021-10-29 NOTE — Telephone Encounter (Signed)
Patient returned your call, please advise. 

## 2021-10-29 NOTE — Telephone Encounter (Signed)
Pt stated that after she submitted the stool test then she realized that she was supposed to stop the pantoprazole for two weeks. Pt notified that we need to repeat the test to ensure that it is correct due to the fact that the pantoprazole can alter the results: Order placed in epic: Pt made aware to come back and submit stool sample 2 weeks after being off of the pantoprazole: Pt verbalized understanding with all questions answered.
